# Patient Record
Sex: Female | Born: 1955 | Race: White | Hispanic: No | Marital: Single | Smoking: Current every day smoker
Health system: Southern US, Community
[De-identification: ages and names within clinical notes are randomized; demographics above are authoritative.]

---

## 2016-03-20 DIAGNOSIS — M1990 Unspecified osteoarthritis, unspecified site: Secondary | ICD-10-CM | POA: Diagnosis not present

## 2016-03-20 DIAGNOSIS — Z1211 Encounter for screening for malignant neoplasm of colon: Secondary | ICD-10-CM | POA: Diagnosis not present

## 2016-03-20 DIAGNOSIS — Z91038 Other insect allergy status: Secondary | ICD-10-CM | POA: Diagnosis not present

## 2016-03-20 DIAGNOSIS — R413 Other amnesia: Secondary | ICD-10-CM | POA: Diagnosis not present

## 2016-03-20 DIAGNOSIS — Z1389 Encounter for screening for other disorder: Secondary | ICD-10-CM | POA: Diagnosis not present

## 2016-03-20 DIAGNOSIS — I10 Essential (primary) hypertension: Secondary | ICD-10-CM | POA: Diagnosis not present

## 2016-03-20 DIAGNOSIS — F329 Major depressive disorder, single episode, unspecified: Secondary | ICD-10-CM | POA: Diagnosis not present

## 2016-03-22 DIAGNOSIS — E538 Deficiency of other specified B group vitamins: Secondary | ICD-10-CM | POA: Diagnosis not present

## 2016-04-02 DIAGNOSIS — F0390 Unspecified dementia without behavioral disturbance: Secondary | ICD-10-CM | POA: Diagnosis not present

## 2016-04-02 DIAGNOSIS — R413 Other amnesia: Secondary | ICD-10-CM | POA: Diagnosis not present

## 2016-04-04 DIAGNOSIS — G309 Alzheimer's disease, unspecified: Secondary | ICD-10-CM | POA: Diagnosis not present

## 2016-04-04 DIAGNOSIS — G300 Alzheimer's disease with early onset: Secondary | ICD-10-CM | POA: Diagnosis not present

## 2016-05-07 DIAGNOSIS — Z23 Encounter for immunization: Secondary | ICD-10-CM | POA: Diagnosis not present

## 2016-05-09 DIAGNOSIS — F17200 Nicotine dependence, unspecified, uncomplicated: Secondary | ICD-10-CM | POA: Diagnosis not present

## 2016-05-09 DIAGNOSIS — Z79899 Other long term (current) drug therapy: Secondary | ICD-10-CM | POA: Diagnosis not present

## 2016-05-09 DIAGNOSIS — Z1211 Encounter for screening for malignant neoplasm of colon: Secondary | ICD-10-CM | POA: Diagnosis not present

## 2016-05-09 DIAGNOSIS — F329 Major depressive disorder, single episode, unspecified: Secondary | ICD-10-CM | POA: Diagnosis not present

## 2016-05-09 DIAGNOSIS — I10 Essential (primary) hypertension: Secondary | ICD-10-CM | POA: Diagnosis not present

## 2016-05-20 DIAGNOSIS — E538 Deficiency of other specified B group vitamins: Secondary | ICD-10-CM | POA: Diagnosis not present

## 2016-05-20 DIAGNOSIS — J309 Allergic rhinitis, unspecified: Secondary | ICD-10-CM | POA: Diagnosis not present

## 2016-05-20 DIAGNOSIS — R413 Other amnesia: Secondary | ICD-10-CM | POA: Diagnosis not present

## 2016-05-20 DIAGNOSIS — Z124 Encounter for screening for malignant neoplasm of cervix: Secondary | ICD-10-CM | POA: Diagnosis not present

## 2016-05-27 DIAGNOSIS — Z1231 Encounter for screening mammogram for malignant neoplasm of breast: Secondary | ICD-10-CM | POA: Diagnosis not present

## 2016-07-28 DIAGNOSIS — J019 Acute sinusitis, unspecified: Secondary | ICD-10-CM | POA: Diagnosis not present

## 2016-07-28 DIAGNOSIS — R509 Fever, unspecified: Secondary | ICD-10-CM | POA: Diagnosis not present

## 2016-07-28 DIAGNOSIS — J209 Acute bronchitis, unspecified: Secondary | ICD-10-CM | POA: Diagnosis not present

## 2016-10-20 DIAGNOSIS — J209 Acute bronchitis, unspecified: Secondary | ICD-10-CM | POA: Diagnosis not present

## 2016-10-20 DIAGNOSIS — H6691 Otitis media, unspecified, right ear: Secondary | ICD-10-CM | POA: Diagnosis not present

## 2017-03-17 DIAGNOSIS — J309 Allergic rhinitis, unspecified: Secondary | ICD-10-CM | POA: Diagnosis not present

## 2017-03-17 DIAGNOSIS — M1990 Unspecified osteoarthritis, unspecified site: Secondary | ICD-10-CM | POA: Diagnosis not present

## 2017-03-17 DIAGNOSIS — R413 Other amnesia: Secondary | ICD-10-CM | POA: Diagnosis not present

## 2017-03-17 DIAGNOSIS — I10 Essential (primary) hypertension: Secondary | ICD-10-CM | POA: Diagnosis not present

## 2017-03-17 DIAGNOSIS — Z681 Body mass index (BMI) 19 or less, adult: Secondary | ICD-10-CM | POA: Diagnosis not present

## 2017-03-30 DIAGNOSIS — K8020 Calculus of gallbladder without cholecystitis without obstruction: Secondary | ICD-10-CM | POA: Diagnosis not present

## 2017-03-30 DIAGNOSIS — F17210 Nicotine dependence, cigarettes, uncomplicated: Secondary | ICD-10-CM | POA: Diagnosis not present

## 2017-03-30 DIAGNOSIS — J189 Pneumonia, unspecified organism: Secondary | ICD-10-CM | POA: Diagnosis not present

## 2017-03-30 DIAGNOSIS — I10 Essential (primary) hypertension: Secondary | ICD-10-CM | POA: Diagnosis not present

## 2017-03-30 DIAGNOSIS — M2560 Stiffness of unspecified joint, not elsewhere classified: Secondary | ICD-10-CM | POA: Diagnosis not present

## 2017-03-30 DIAGNOSIS — F0280 Dementia in other diseases classified elsewhere without behavioral disturbance: Secondary | ICD-10-CM | POA: Diagnosis not present

## 2017-03-30 DIAGNOSIS — Z880 Allergy status to penicillin: Secondary | ICD-10-CM | POA: Diagnosis not present

## 2017-03-30 DIAGNOSIS — R634 Abnormal weight loss: Secondary | ICD-10-CM | POA: Diagnosis not present

## 2017-03-30 DIAGNOSIS — R4182 Altered mental status, unspecified: Secondary | ICD-10-CM | POA: Diagnosis not present

## 2017-03-30 DIAGNOSIS — J449 Chronic obstructive pulmonary disease, unspecified: Secondary | ICD-10-CM | POA: Diagnosis not present

## 2017-04-08 DIAGNOSIS — J432 Centrilobular emphysema: Secondary | ICD-10-CM | POA: Diagnosis not present

## 2017-04-08 DIAGNOSIS — N3001 Acute cystitis with hematuria: Secondary | ICD-10-CM | POA: Diagnosis not present

## 2017-04-08 DIAGNOSIS — I10 Essential (primary) hypertension: Secondary | ICD-10-CM | POA: Diagnosis not present

## 2017-04-08 DIAGNOSIS — R938 Abnormal findings on diagnostic imaging of other specified body structures: Secondary | ICD-10-CM | POA: Diagnosis not present

## 2017-04-08 DIAGNOSIS — G9341 Metabolic encephalopathy: Secondary | ICD-10-CM | POA: Diagnosis not present

## 2017-04-08 DIAGNOSIS — Z6820 Body mass index (BMI) 20.0-20.9, adult: Secondary | ICD-10-CM | POA: Diagnosis not present

## 2017-04-09 DIAGNOSIS — Z1211 Encounter for screening for malignant neoplasm of colon: Secondary | ICD-10-CM | POA: Diagnosis not present

## 2017-05-10 ENCOUNTER — Encounter (HOSPITAL_COMMUNITY): Payer: Self-pay

## 2017-05-10 ENCOUNTER — Emergency Department (HOSPITAL_COMMUNITY): Payer: PPO

## 2017-05-10 ENCOUNTER — Emergency Department (HOSPITAL_COMMUNITY)
Admission: EM | Admit: 2017-05-10 | Discharge: 2017-05-11 | Disposition: A | Discharge status: Home / Self Care | Payer: PPO | Attending: Emergency Medicine | Admitting: Emergency Medicine

## 2017-05-10 DIAGNOSIS — R410 Disorientation, unspecified: Secondary | ICD-10-CM

## 2017-05-10 DIAGNOSIS — F0390 Unspecified dementia without behavioral disturbance: Secondary | ICD-10-CM

## 2017-05-10 DIAGNOSIS — Z79899 Other long term (current) drug therapy: Secondary | ICD-10-CM | POA: Insufficient documentation

## 2017-05-10 DIAGNOSIS — J90 Pleural effusion, not elsewhere classified: Secondary | ICD-10-CM | POA: Insufficient documentation

## 2017-05-10 DIAGNOSIS — F17200 Nicotine dependence, unspecified, uncomplicated: Secondary | ICD-10-CM | POA: Diagnosis not present

## 2017-05-10 DIAGNOSIS — R918 Other nonspecific abnormal finding of lung field: Secondary | ICD-10-CM | POA: Diagnosis not present

## 2017-05-10 DIAGNOSIS — R4182 Altered mental status, unspecified: Secondary | ICD-10-CM | POA: Diagnosis not present

## 2017-05-10 LAB — PROTIME-INR
INR: 0.97
PROTHROMBIN TIME: 12.8 s (ref 11.4–15.2)

## 2017-05-10 LAB — CBC WITH DIFFERENTIAL/PLATELET
BASOS PCT: 0 %
Basophils Absolute: 0 10*3/uL (ref 0.0–0.1)
EOS ABS: 0.1 10*3/uL (ref 0.0–0.7)
Eosinophils Relative: 2 %
HCT: 30.8 % — ABNORMAL LOW (ref 36.0–46.0)
Hemoglobin: 10.1 g/dL — ABNORMAL LOW (ref 12.0–15.0)
LYMPHS ABS: 2.5 10*3/uL (ref 0.7–4.0)
Lymphocytes Relative: 43 %
MCH: 29.4 pg (ref 26.0–34.0)
MCHC: 32.8 g/dL (ref 30.0–36.0)
MCV: 89.8 fL (ref 78.0–100.0)
Monocytes Absolute: 0.6 10*3/uL (ref 0.1–1.0)
Monocytes Relative: 10 %
NEUTROS PCT: 45 %
Neutro Abs: 2.6 10*3/uL (ref 1.7–7.7)
Platelets: 216 10*3/uL (ref 150–400)
RBC: 3.43 MIL/uL — AB (ref 3.87–5.11)
RDW: 15.8 % — ABNORMAL HIGH (ref 11.5–15.5)
WBC: 5.8 10*3/uL (ref 4.0–10.5)

## 2017-05-10 LAB — URINALYSIS, ROUTINE W REFLEX MICROSCOPIC
BACTERIA UA: NONE SEEN
BILIRUBIN URINE: NEGATIVE
Glucose, UA: NEGATIVE mg/dL
Ketones, ur: NEGATIVE mg/dL
Leukocytes, UA: NEGATIVE
NITRITE: NEGATIVE
PH: 6 (ref 5.0–8.0)
Protein, ur: NEGATIVE mg/dL
SPECIFIC GRAVITY, URINE: 1.017 (ref 1.005–1.030)

## 2017-05-10 LAB — COMPREHENSIVE METABOLIC PANEL
ALBUMIN: 3.4 g/dL — AB (ref 3.5–5.0)
ALT: 22 U/L (ref 14–54)
ANION GAP: 7 (ref 5–15)
AST: 29 U/L (ref 15–41)
Alkaline Phosphatase: 74 U/L (ref 38–126)
BILIRUBIN TOTAL: 0.5 mg/dL (ref 0.3–1.2)
BUN: 17 mg/dL (ref 6–20)
CO2: 26 mmol/L (ref 22–32)
Calcium: 8.6 mg/dL — ABNORMAL LOW (ref 8.9–10.3)
Chloride: 105 mmol/L (ref 101–111)
Creatinine, Ser: 0.63 mg/dL (ref 0.44–1.00)
GFR calc Af Amer: >60 mL/min (ref >60)
GFR calc non Af Amer: >60 mL/min (ref >60)
Glucose, Bld: 94 mg/dL (ref 65–99)
POTASSIUM: 3.4 mmol/L — AB (ref 3.5–5.1)
SODIUM: 138 mmol/L (ref 135–145)
TOTAL PROTEIN: 7.4 g/dL (ref 6.5–8.1)

## 2017-05-10 LAB — I-STAT CG4 LACTIC ACID, ED: Lactic Acid, Venous: 0.86 mmol/L (ref 0.5–1.9)

## 2017-05-10 LAB — CBG MONITORING, ED: Glucose-Capillary: 88 mg/dL (ref 65–99)

## 2017-05-10 NOTE — ED Notes (Signed)
The pt  Remains alert warm blankets given

## 2017-05-10 NOTE — ED Notes (Signed)
Family reports that the pt has been more confused for one month  She  Sees people that are not there.  She has tried to leave home numerus times  This behavior appears to be getting worse  The pt reports that she does not know why she is here

## 2017-05-10 NOTE — ED Provider Notes (Signed)
[facility]-EMERGENCY DEPT Provider Note   CSN: [id number] Arrival date & time: 05/10/17  2110  Time seen 23:04 PM   History   Chief Complaint Chief Complaint  Patient presents with  . Altered Mental Status    HPI Monique Walker is a 61 y.o. female.  HPI  she presents to the emergency department with her son and her sister. They report patient was diagnosed with memory problems about 3 years ago. She has been on donezapril for about one year. Patient states about a month ago they increased the dose from 5 mg up to 10 however she seemed to be having more confusion and they dropped back down to 5 mg about 2 weeks ago. Son states about a month ago she had a UTI was diagnosed when she presented with increasing confusion. She was diagnosed with pneumonia, UTI and dehydration. She was treated in the ED and discharged home with antibiotics. She  followed up at her primary care office a week later and was improved. She was supposed to have her repeat urinalysis because of some hematuria in a month which will be this coming week. They report that she was doing well  3 days ago however today she was out with her sister and they were shopping. She refused to get back into her sister's car and finally she got in the car and when they got home she kept trying to leave her sister's house. Patient states she doesn't know why she's here. She does not say she feels bad. 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 When he played it back to her today she did not know that was her that had called and left the message. She has also been taking things out of the house and leaving them outside because she is cleaning, but family states they are things that shouldn't be thrown away.   PCP Dr Wilber [facility] in [place]  History reviewed. No pertinent past medical  history.  There are no active problems to display for this patient.   History reviewed. No pertinent surgical history.  OB History    No data available       Home Medications    Prior to Admission medications   Medication Sig Start Date End Date Taking? Authorizing Provider  albuterol (PROVENTIL HFA;VENTOLIN HFA) 108 (90 Base) MCG/ACT inhaler Inhale 1-2 puffs into the lungs every 6 (six) hours as needed for wheezing or shortness of breath.   Yes [provider]  citalopram (CELEXA) 20 MG tablet Take 20 mg by mouth every morning.   Yes [provider]  donepezil (ARICEPT) 10 MG tablet Take 5 mg by mouth at bedtime.   Yes [provider]  fexofenadine (ALLEGRA) 180 MG tablet Take 180 mg by mouth daily.   Yes [provider]  lisinopril (PRINIVIL,ZESTRIL) 40 MG tablet Take 40 mg by mouth daily.   Yes [provider]  meloxicam (MOBIC) 7.5 MG tablet Take 7.5 mg by mouth every morning.   Yes [provider]  Multiple Vitamin (MULTIVITAMIN WITH MINERALS) TABS tablet Take 1 tablet by mouth every morning.   Yes [provider]    Family History History reviewed. No pertinent family history.  Social History Social History  Substance Use Topics  . Smoking status: Current Every  Day Smoker  . Smokeless tobacco: Never Used  . Alcohol use Not on file  lives alone Smokes < 1 ppd   Allergies   Penicillins   Review of Systems Review of Systems  All other systems reviewed and are negative.    Physical Exam Updated Vital Signs BP (!) 189/93   Pulse 66   Temp 99.1 F (37.3 C) (Oral)   Resp (!) 8   SpO2 99%   Vital signs normal except hypertension, low grade temp   Physical Exam  Constitutional:  Non-toxic appearance. She does not appear ill. No distress.  Thin female  HENT:  Head: Normocephalic and atraumatic.  Right Ear: External ear normal.  Left Ear: External ear normal.  Nose: Nose normal. No mucosal  edema or rhinorrhea.  Mouth/Throat: Oropharynx is clear and moist and mucous membranes are normal. No dental abscesses or uvula swelling.  Eyes: Pupils are equal, round, and reactive to light. Conjunctivae and EOM are normal.  Neck: Normal range of motion and full passive range of motion without pain. Neck supple.  Cardiovascular: Normal rate, regular rhythm and normal heart sounds.  Exam reveals no gallop and no friction rub.   No murmur heard. Pulmonary/Chest: Effort normal and breath sounds normal. No respiratory distress. She has no wheezes. She has no rhonchi. She has no rales. She exhibits no tenderness and no crepitus.  Abdominal: Soft. Normal appearance and bowel sounds are normal. She exhibits no distension. There is no tenderness. There is no rebound and no guarding.  Musculoskeletal: Normal range of motion. She exhibits no edema or tenderness.  Moves all extremities well.   Neurological: She is alert. She has normal strength. No cranial nerve deficit.  Does not know the correct year (2019), doesn't know the month, doesn't know the season, but states it is getting cooler, doesn't know the day of the week. Recognizes her family. Grips equal and strong bilaterally. No focal motor weakness.   Skin: Skin is warm, dry and intact. No rash noted. No erythema. No pallor.  Psychiatric: She has a normal mood and affect. Her speech is normal and behavior is normal. Her mood appears not anxious.  Nursing note and vitals reviewed.    ED Treatments / Results  Labs (all labs ordered are listed, but only abnormal results are displayed) Results for orders placed or performed during the hospital encounter of 05/10/17  Comprehensive metabolic panel  Result Value Ref Range   Sodium 138 135 - 145 mmol/L   Potassium 3.4 (L) 3.5 - 5.1 mmol/L   Chloride 105 101 - 111 mmol/L   CO2 26 22 - 32 mmol/L   Glucose, Bld 94 65 - 99 mg/dL   BUN 17 6 - 20 mg/dL   Creatinine, Ser 1.61 0.44 - 1.00 mg/dL    Calcium 8.6 (L) 8.9 - 10.3 mg/dL   Total Protein 7.4 6.5 - 8.1 g/dL   Albumin 3.4 (L) 3.5 - 5.0 g/dL   AST 29 15 - 41 U/L   ALT 22 14 - 54 U/L   Alkaline Phosphatase 74 38 - 126 U/L   Total Bilirubin 0.5 0.3 - 1.2 mg/dL   GFR calc non Af Amer >60 >60 mL/min   GFR calc Af Amer >60 >60 mL/min   Anion gap 7 5 - 15  CBC with Differential  Result Value Ref Range   WBC 5.8 4.0 - 10.5 K/uL   RBC 3.43 (L) 3.87 - 5.11 MIL/uL   Hemoglobin 10.1 (L) 12.0 - 15.0  g/dL   HCT 16.1 (L) 09.6 - 04.5 %   MCV 89.8 78.0 - 100.0 fL   MCH 29.4 26.0 - 34.0 pg   MCHC 32.8 30.0 - 36.0 g/dL   RDW 40.9 (H) 81.1 - 91.4 %   Platelets 216 150 - 400 K/uL   Neutrophils Relative % 45 %   Neutro Abs 2.6 1.7 - 7.7 K/uL   Lymphocytes Relative 43 %   Lymphs Abs 2.5 0.7 - 4.0 K/uL   Monocytes Relative 10 %   Monocytes Absolute 0.6 0.1 - 1.0 K/uL   Eosinophils Relative 2 %   Eosinophils Absolute 0.1 0.0 - 0.7 K/uL   Basophils Relative 0 %   Basophils Absolute 0.0 0.0 - 0.1 K/uL  Protime-INR  Result Value Ref Range   Prothrombin Time 12.8 11.4 - 15.2 seconds   INR 0.97   Urinalysis, Routine w reflex microscopic  Result Value Ref Range   Color, Urine YELLOW YELLOW   APPearance CLEAR CLEAR   Specific Gravity, Urine 1.017 1.005 - 1.030   pH 6.0 5.0 - 8.0   Glucose, UA NEGATIVE NEGATIVE mg/dL   Hgb urine dipstick SMALL (A) NEGATIVE   Bilirubin Urine NEGATIVE NEGATIVE   Ketones, ur NEGATIVE NEGATIVE mg/dL   Protein, ur NEGATIVE NEGATIVE mg/dL   Nitrite NEGATIVE NEGATIVE   Leukocytes, UA NEGATIVE NEGATIVE   RBC / HPF 0-5 0 - 5 RBC/hpf   WBC, UA 0-5 0 - 5 WBC/hpf   Bacteria, UA NONE SEEN NONE SEEN   Squamous Epithelial / LPF 0-5 (A) NONE SEEN  CBG monitoring, ED  Result Value Ref Range   Glucose-Capillary 88 65 - 99 mg/dL  I-Stat CG4 Lactic Acid, ED  Result Value Ref Range   Lactic Acid, Venous 0.86 0.5 - 1.9 mmol/L  I-Stat CG4 Lactic Acid, ED  Result Value Ref Range   Lactic Acid, Venous 0.88 0.5 - 1.9  mmol/L   Laboratory interpretation all normal except mild hypokalemia, mild anemia    EKG  EKG Interpretation None       Radiology Dg Chest 2 View  Result Date: 05/10/2017 CLINICAL DATA:  61 y/o F; history of urinary tract infection post antibiotic therapy and history of pneumonia. EXAM: CHEST  2 VIEW COMPARISON:  None. FINDINGS: Mild cardiomegaly. Aortic atherosclerosis with calcification. Soft tissue thickening at the right lung apex and small right pleural effusion. No focal consolidation. No pneumothorax. T7 mild compression deformity, age indeterminate. IMPRESSION: 1. Soft tissue thickening at the right lung apex may represent pleuroparenchymal scarring versus Pancoast tumor. CT of chest is recommended to further characterize. 2. Small right pleural effusion. 3. No consolidation. 4. Age-indeterminate T7 mild compression deformity. Electronically Signed   By: Mitzi Hansen M.D.   On: 05/10/2017 22:13   Ct Head Wo Contrast  Result Date: 05/10/2017 CLINICAL DATA:  61 y/o F; altered mental status since urinary tract infection 1 month ago. EXAM: CT HEAD WITHOUT CONTRAST TECHNIQUE: Contiguous axial images were obtained from the base of the skull through the vertex without intravenous contrast. COMPARISON:  04/02/2016 MRI the head. FINDINGS: Brain: No evidence of acute infarction, hemorrhage, hydrocephalus, extra-axial collection or mass lesion/mass effect. Few nonspecific foci of hypoattenuation in subcortical periventricular white matter compatible with mild chronic microvascular ischemic changes. Mild parenchymal volume loss for age. Vascular: No hyperdense vessel or unexpected calcification. Skull: Normal. Negative for fracture or focal lesion. Sinuses/Orbits: No acute finding. Other: None. IMPRESSION: 1. No acute intracranial abnormality identified. 2. Mild chronic microvascular ischemic changes  and mild parenchymal volume loss of the brain. Electronically Signed   By: Mitzi Hansen M.D.   On: 05/10/2017 22:20   Ct Chest W Contrast  Result Date: 05/11/2017 CLINICAL DATA:  Smoker. Abnormal chest x-ray. Increasing confusion. EXAM: CT CHEST WITH CONTRAST TECHNIQUE: Multidetector CT imaging of the chest was performed during intravenous contrast administration. CONTRAST:  75 cc ISOVUE-300 IOPAMIDOL (ISOVUE-300) INJECTION 61% COMPARISON:  Chest radiograph earlier this day. Report from chest CT 03/30/2017, images not available. FINDINGS: Cardiovascular: Aortic atherosclerosis without aneurysm. Mild cardiomegaly. Scattered coronary artery calcifications. No pericardial fluid. Mediastinum/Nodes: Prominent right hilar node measures 11 mm short axis. Precarinal node measures 9 mm, subcarinal node measures 11 mm. Additional small prevascular nodes not enlarged by size criteria. The esophagus is decompressed. Visualized thyroid gland is normal. Lungs/Pleura: Right apical pleuroparenchymal scarring, with bronchiectasis and air bronchograms. Additional in bronchiectasis in the right middle lobe. There is mild diffuse pleural thickening throughout the right hemithorax. Bandlike opacity in the right lower lobe. 7 mm irregular opacity in the right middle lobe image 78 series 4 is likely related to pleuroparenchymal scarring, however is nonspecific. Patchy left lower lobe ground-glass opacities. No dominant pulmonary mass. Minimal mucus in the trachea. Upper Abdomen: No acute abnormality.  Normal adrenal glands. Musculoskeletal: Mild T8 inferior endplate compression fracture, appears chronic. Scattered degenerative disc disease in the thoracic spine. IMPRESSION: 1. Right apical pleuroparenchymal opacity, with associated bronchiectasis, likely related to scarring. This was described on chest CT 1 month prior. Chronic bronchiectasis in the right middle lobe. 2. Irregular 7 mm right middle lobe opacity which may be related to pleuroparenchymal scarring but is nonspecific. Recommend follow-up as  a pulmonary nodule. Non-contrast chest CT at 6-12 months is recommended. If the nodule is stable at time of repeat CT, then future CT at 18-24 months (from today's scan) is considered optional for low-risk patients, but is recommended for high-risk patients. This recommendation follows the consensus statement: Guidelines for Management of Incidental Pulmonary Nodules Detected on CT Images: From the Fleischner Society 2017; Radiology 2017; 284:228-243. 3. Mild mediastinal and right hilar adenopathy with slight decrease per report. . Electronically Signed   By: Rubye Oaks M.D.   On: 05/11/2017 02:38    Procedures Procedures (including critical care time)  Medications Ordered in ED Medications  iopamidol (ISOVUE-300) 61 % injection (75 mLs  Contrast Given 05/11/17 0133)     Initial Impression / Assessment and Plan / ED Course  I have reviewed the triage vital signs and the nursing notes.  Pertinent labs & imaging results that were available during my care of the patient were reviewed by me and considered in my medical decision making (see chart for details).     We discussed her test results which do not reveal a medical reason for her worsening confusion. Family wants to go ahead and do the chest CT tonight. We also discussed she should f/u with her primary care doctor about the progression of her dementia and consider evaluation by a psychiatrist for medication recommendationsAnd a neurologist to get a more specific diagnosis of her dementia..   Radiologist called her chest CT results, evidentially patient had already had a CT of her chest and abd pelvis and head done on Aug 18 at [facility] that family did not make me aware of.    Final Clinical Impressions(s) / ED Diagnoses   Final diagnoses:  Confusion  Dementia without behavioral disturbance, unspecified dementia type   Plan discharge  Devoria Albe, MD,  Concha Pyo, MD 05/11/17 [phone]

## 2017-05-10 NOTE — ED Notes (Addendum)
Pt. Taken to xray, xray will return pt to room

## 2017-05-10 NOTE — ED Triage Notes (Signed)
Pt. Family states she had a UTI a month ago and pt. Presented altered. Family states the antibiotics helped the ALMS once UTI was cleared. Family states they were supposed to follow up for blood found in urine but decided to come to the emergency room instead.

## 2017-05-11 ENCOUNTER — Encounter (HOSPITAL_COMMUNITY): Payer: Self-pay | Admitting: Radiology

## 2017-05-11 ENCOUNTER — Emergency Department (HOSPITAL_COMMUNITY): Payer: PPO

## 2017-05-11 DIAGNOSIS — R918 Other nonspecific abnormal finding of lung field: Secondary | ICD-10-CM | POA: Diagnosis not present

## 2017-05-11 LAB — I-STAT CG4 LACTIC ACID, ED: Lactic Acid, Venous: 0.88 mmol/L (ref 0.5–1.9)

## 2017-05-11 MED ORDER — IOPAMIDOL (ISOVUE-300) INJECTION 61%
INTRAVENOUS | Status: AC
  Administered 2017-05-11: 75 mL
  Filled 2017-05-11: qty 75

## 2017-05-11 MED ORDER — IOPAMIDOL (ISOVUE-300) INJECTION 61%
INTRAVENOUS | Status: AC
  Filled 2017-05-11: qty 100

## 2017-05-11 NOTE — Discharge Instructions (Signed)
You need to talk to your doctor about getting a referral to be evaluated by a neurologist and a psychiatrist for further evaluation of your memory problems and intermittent confusion. A urine culture was sent to night however your urine did not appear to have an acute infection tonight. Your CT scan showed some chronic scarring in your right chest. The radiologist recommends a repeat CT scan of your chest in 6-12 months.

## 2017-05-12 LAB — URINE CULTURE

## 2017-05-15 LAB — CULTURE, BLOOD (ROUTINE X 2)
CULTURE: NO GROWTH
Culture: NO GROWTH
SPECIAL REQUESTS: ADEQUATE
Special Requests: ADEQUATE

## 2017-05-16 DIAGNOSIS — I10 Essential (primary) hypertension: Secondary | ICD-10-CM | POA: Diagnosis not present

## 2017-05-16 DIAGNOSIS — R413 Other amnesia: Secondary | ICD-10-CM | POA: Diagnosis not present

## 2017-05-16 DIAGNOSIS — Z6822 Body mass index (BMI) 22.0-22.9, adult: Secondary | ICD-10-CM | POA: Diagnosis not present

## 2017-06-09 DIAGNOSIS — Z23 Encounter for immunization: Secondary | ICD-10-CM | POA: Diagnosis not present

## 2017-07-15 DIAGNOSIS — Z6824 Body mass index (BMI) 24.0-24.9, adult: Secondary | ICD-10-CM | POA: Diagnosis not present

## 2017-07-15 DIAGNOSIS — J40 Bronchitis, not specified as acute or chronic: Secondary | ICD-10-CM | POA: Diagnosis not present

## 2017-08-11 DIAGNOSIS — N3001 Acute cystitis with hematuria: Secondary | ICD-10-CM | POA: Diagnosis not present

## 2017-08-11 DIAGNOSIS — N3091 Cystitis, unspecified with hematuria: Secondary | ICD-10-CM | POA: Diagnosis not present

## 2017-08-15 DIAGNOSIS — G309 Alzheimer's disease, unspecified: Secondary | ICD-10-CM | POA: Diagnosis not present

## 2017-08-15 DIAGNOSIS — F0390 Unspecified dementia without behavioral disturbance: Secondary | ICD-10-CM | POA: Diagnosis not present

## 2017-08-15 DIAGNOSIS — R350 Frequency of micturition: Secondary | ICD-10-CM | POA: Diagnosis not present

## 2017-08-15 DIAGNOSIS — F0280 Dementia in other diseases classified elsewhere without behavioral disturbance: Secondary | ICD-10-CM | POA: Diagnosis not present

## 2017-08-21 DIAGNOSIS — F411 Generalized anxiety disorder: Secondary | ICD-10-CM | POA: Diagnosis not present

## 2017-08-21 DIAGNOSIS — F320 Major depressive disorder, single episode, mild: Secondary | ICD-10-CM | POA: Diagnosis not present

## 2017-08-21 DIAGNOSIS — F0281 Dementia in other diseases classified elsewhere with behavioral disturbance: Secondary | ICD-10-CM | POA: Diagnosis not present

## 2017-08-29 DIAGNOSIS — G2111 Neuroleptic induced parkinsonism: Secondary | ICD-10-CM | POA: Diagnosis not present

## 2017-08-29 DIAGNOSIS — G309 Alzheimer's disease, unspecified: Secondary | ICD-10-CM | POA: Diagnosis not present

## 2017-09-08 DIAGNOSIS — R413 Other amnesia: Secondary | ICD-10-CM | POA: Diagnosis not present

## 2017-09-08 DIAGNOSIS — I10 Essential (primary) hypertension: Secondary | ICD-10-CM | POA: Diagnosis not present

## 2017-09-08 DIAGNOSIS — R410 Disorientation, unspecified: Secondary | ICD-10-CM | POA: Diagnosis not present

## 2017-09-16 DIAGNOSIS — R443 Hallucinations, unspecified: Secondary | ICD-10-CM | POA: Diagnosis not present

## 2017-09-16 DIAGNOSIS — W540XXA Bitten by dog, initial encounter: Secondary | ICD-10-CM | POA: Diagnosis not present

## 2017-09-16 DIAGNOSIS — R413 Other amnesia: Secondary | ICD-10-CM | POA: Diagnosis not present

## 2017-09-16 DIAGNOSIS — Z6823 Body mass index (BMI) 23.0-23.9, adult: Secondary | ICD-10-CM | POA: Diagnosis not present

## 2017-09-16 DIAGNOSIS — S41159A Open bite of unspecified upper arm, initial encounter: Secondary | ICD-10-CM | POA: Diagnosis not present

## 2017-09-23 DIAGNOSIS — Z6822 Body mass index (BMI) 22.0-22.9, adult: Secondary | ICD-10-CM | POA: Diagnosis not present

## 2017-09-23 DIAGNOSIS — W540XXA Bitten by dog, initial encounter: Secondary | ICD-10-CM | POA: Diagnosis not present

## 2017-09-23 DIAGNOSIS — S41159A Open bite of unspecified upper arm, initial encounter: Secondary | ICD-10-CM | POA: Diagnosis not present

## 2017-09-23 DIAGNOSIS — G309 Alzheimer's disease, unspecified: Secondary | ICD-10-CM | POA: Diagnosis not present

## 2017-09-23 DIAGNOSIS — G2111 Neuroleptic induced parkinsonism: Secondary | ICD-10-CM | POA: Diagnosis not present

## 2017-09-30 DIAGNOSIS — N3000 Acute cystitis without hematuria: Secondary | ICD-10-CM | POA: Diagnosis not present

## 2017-09-30 DIAGNOSIS — R0602 Shortness of breath: Secondary | ICD-10-CM | POA: Diagnosis not present

## 2017-09-30 DIAGNOSIS — N390 Urinary tract infection, site not specified: Secondary | ICD-10-CM

## 2017-09-30 DIAGNOSIS — F0390 Unspecified dementia without behavioral disturbance: Secondary | ICD-10-CM

## 2017-09-30 DIAGNOSIS — E876 Hypokalemia: Secondary | ICD-10-CM

## 2017-09-30 DIAGNOSIS — R55 Syncope and collapse: Secondary | ICD-10-CM

## 2017-09-30 DIAGNOSIS — E860 Dehydration: Secondary | ICD-10-CM | POA: Diagnosis not present

## 2017-09-30 DIAGNOSIS — N179 Acute kidney failure, unspecified: Secondary | ICD-10-CM

## 2017-09-30 DIAGNOSIS — I959 Hypotension, unspecified: Secondary | ICD-10-CM

## 2017-10-01 DIAGNOSIS — K8020 Calculus of gallbladder without cholecystitis without obstruction: Secondary | ICD-10-CM | POA: Diagnosis not present

## 2017-10-01 DIAGNOSIS — R0602 Shortness of breath: Secondary | ICD-10-CM | POA: Diagnosis not present

## 2017-10-01 DIAGNOSIS — R42 Dizziness and giddiness: Secondary | ICD-10-CM | POA: Diagnosis not present

## 2017-10-01 DIAGNOSIS — I959 Hypotension, unspecified: Secondary | ICD-10-CM | POA: Diagnosis not present

## 2017-10-01 DIAGNOSIS — Z6821 Body mass index (BMI) 21.0-21.9, adult: Secondary | ICD-10-CM | POA: Diagnosis not present

## 2017-10-01 DIAGNOSIS — I10 Essential (primary) hypertension: Secondary | ICD-10-CM | POA: Diagnosis not present

## 2017-10-01 DIAGNOSIS — F329 Major depressive disorder, single episode, unspecified: Secondary | ICD-10-CM | POA: Diagnosis not present

## 2017-10-01 DIAGNOSIS — R55 Syncope and collapse: Secondary | ICD-10-CM

## 2017-10-01 DIAGNOSIS — N3000 Acute cystitis without hematuria: Secondary | ICD-10-CM | POA: Diagnosis not present

## 2017-10-01 DIAGNOSIS — E876 Hypokalemia: Secondary | ICD-10-CM | POA: Diagnosis not present

## 2017-10-01 DIAGNOSIS — E872 Acidosis: Secondary | ICD-10-CM | POA: Diagnosis not present

## 2017-10-01 DIAGNOSIS — E860 Dehydration: Secondary | ICD-10-CM | POA: Diagnosis not present

## 2017-10-01 DIAGNOSIS — R627 Adult failure to thrive: Secondary | ICD-10-CM | POA: Diagnosis not present

## 2017-10-01 DIAGNOSIS — N179 Acute kidney failure, unspecified: Secondary | ICD-10-CM | POA: Diagnosis not present

## 2017-10-01 DIAGNOSIS — F17210 Nicotine dependence, cigarettes, uncomplicated: Secondary | ICD-10-CM | POA: Diagnosis not present

## 2017-10-01 DIAGNOSIS — E43 Unspecified severe protein-calorie malnutrition: Secondary | ICD-10-CM | POA: Diagnosis not present

## 2017-10-01 DIAGNOSIS — F0390 Unspecified dementia without behavioral disturbance: Secondary | ICD-10-CM | POA: Diagnosis not present

## 2017-10-01 DIAGNOSIS — Z880 Allergy status to penicillin: Secondary | ICD-10-CM | POA: Diagnosis not present

## 2017-10-01 DIAGNOSIS — R531 Weakness: Secondary | ICD-10-CM | POA: Diagnosis not present

## 2017-10-01 DIAGNOSIS — Z79899 Other long term (current) drug therapy: Secondary | ICD-10-CM | POA: Diagnosis not present

## 2017-10-01 DIAGNOSIS — M1990 Unspecified osteoarthritis, unspecified site: Secondary | ICD-10-CM | POA: Diagnosis not present

## 2017-10-01 DIAGNOSIS — Z882 Allergy status to sulfonamides status: Secondary | ICD-10-CM | POA: Diagnosis not present

## 2017-10-01 DIAGNOSIS — F0391 Unspecified dementia with behavioral disturbance: Secondary | ICD-10-CM | POA: Diagnosis not present

## 2017-10-01 DIAGNOSIS — N390 Urinary tract infection, site not specified: Secondary | ICD-10-CM | POA: Diagnosis not present

## 2017-10-03 IMAGING — CT CT CHEST W/ CM
2 of 4 series · 15 of 36 positions shown, 18 images · IV contrast (iopamidol)
Comparison: Chest radiograph earlier this day. Report from chest CT
03/30/2017, images not available.

CLINICAL DATA: Smoker. Abnormal chest x-ray. Increasing confusion.

EXAM:
CT CHEST WITH CONTRAST
TECHNIQUE: Multidetector CT imaging of the chest was performed during
intravenous contrast administration.
CONTRAST:  75 cc S2UDHM-VOO IOPAMIDOL (S2UDHM-VOO) INJECTION 61%

[Series 3: chest wo · axial · 0.61mm/px · z∈[-200,+42]mm · 12 of 143 slices shown, 15 images]
[im 11/143  mediastinal]
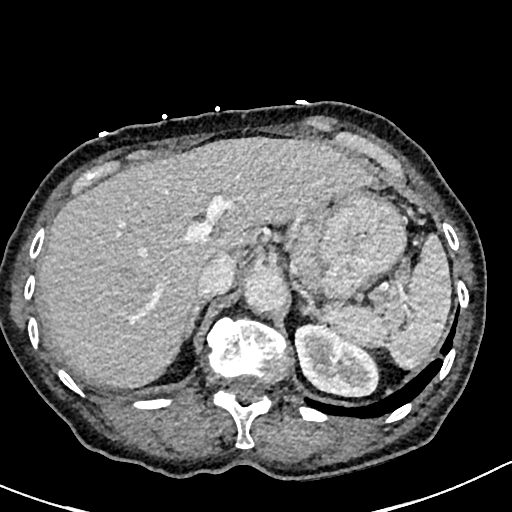
[im 11/143  lung]
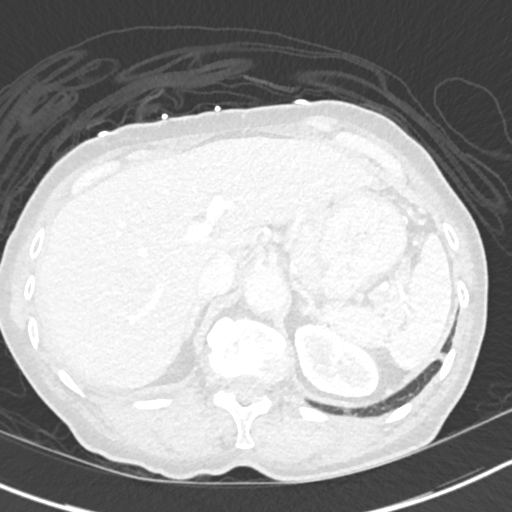
[im 22/143  lung]
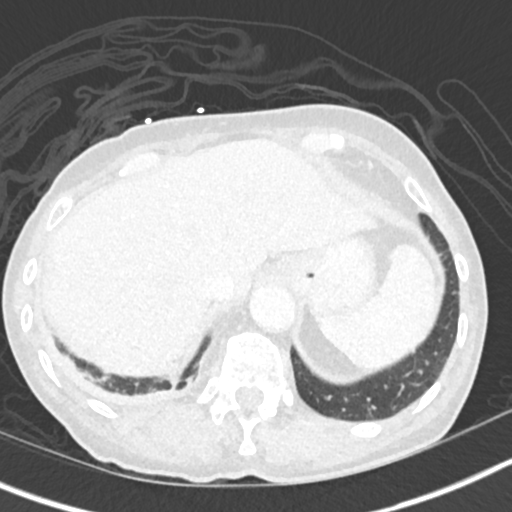
[im 33/143  lung]
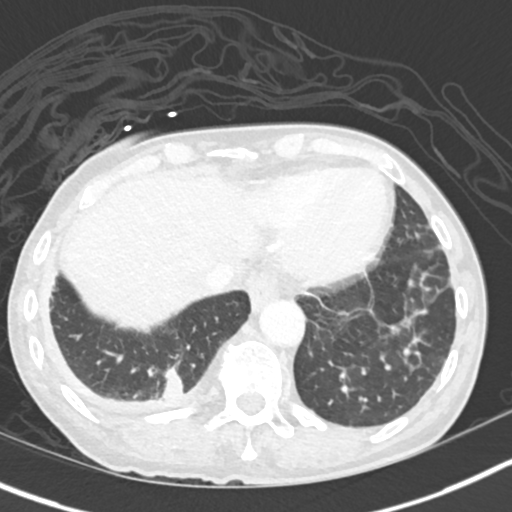
[im 44/143  lung]
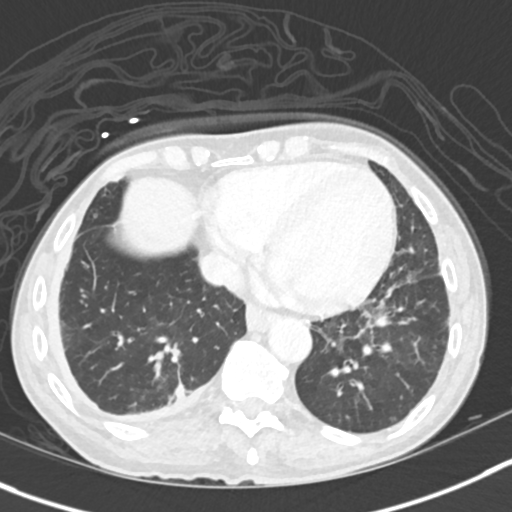
[im 55/143  mediastinal]
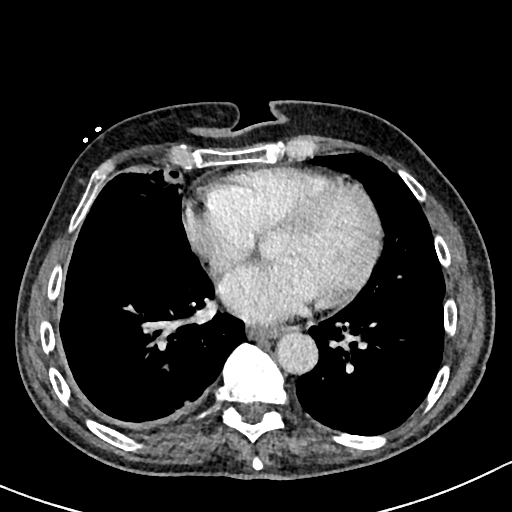
[im 55/143  lung]
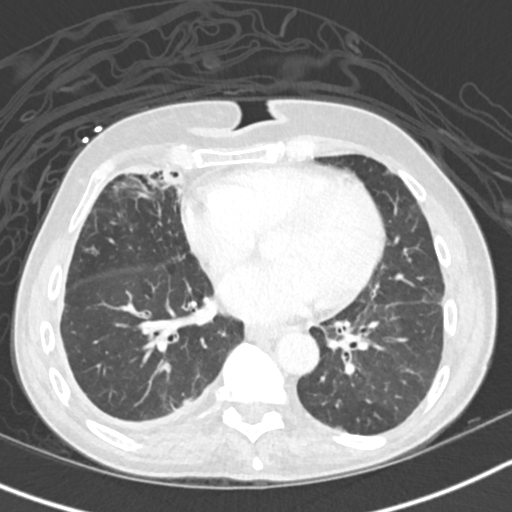
[im 66/143  lung]
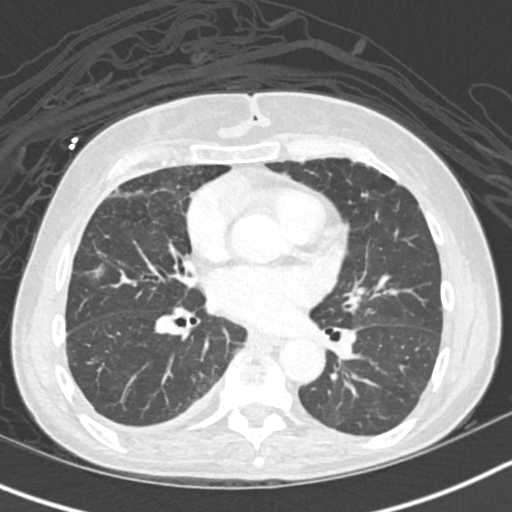
[im 77/143  lung]
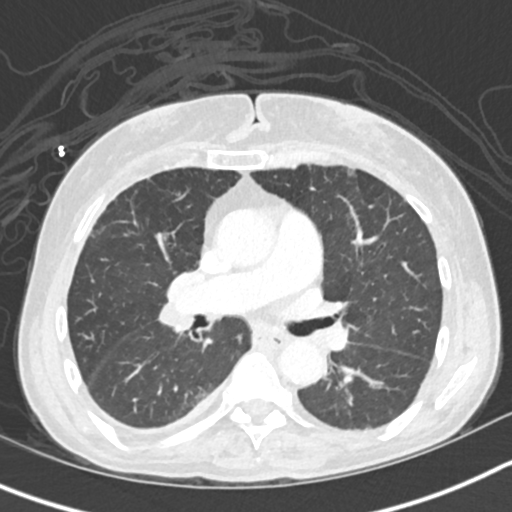
[im 88/143  lung]
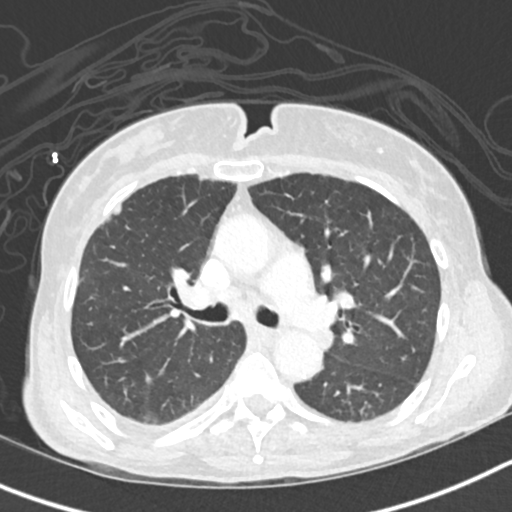
[im 99/143  mediastinal]
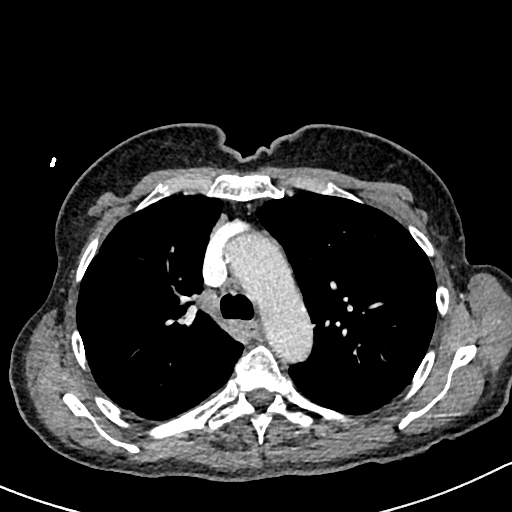
[im 99/143  lung]
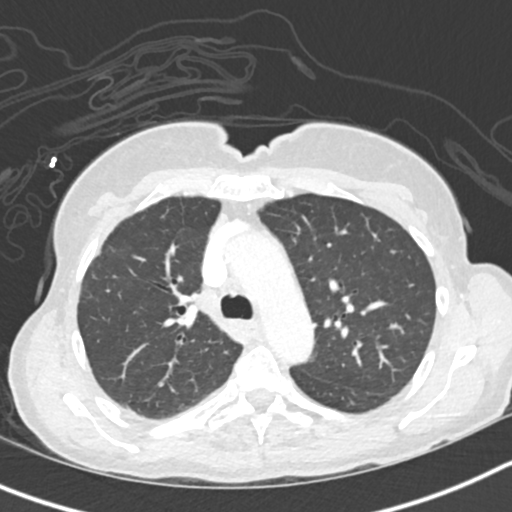
[im 110/143  lung]
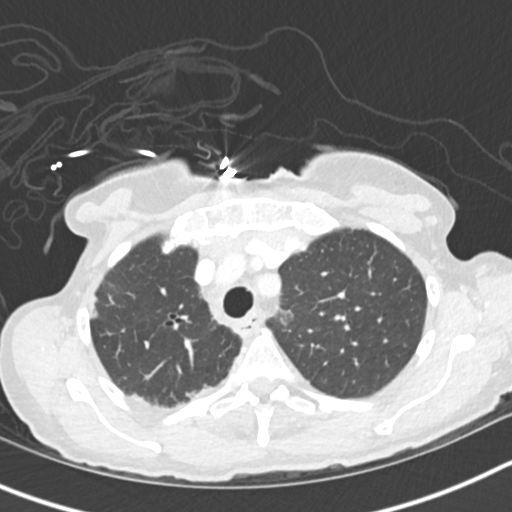
[im 121/143  lung]
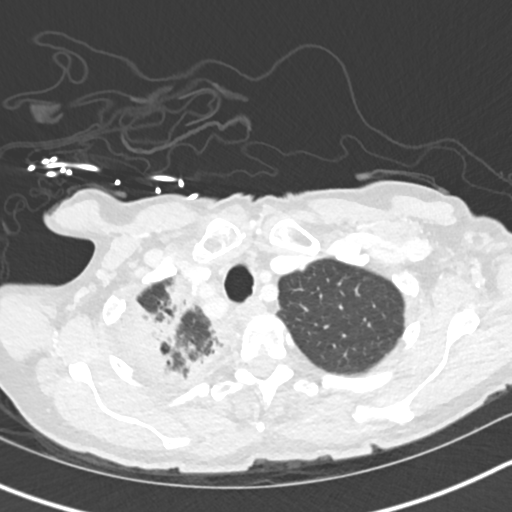
[im 132/143  lung]
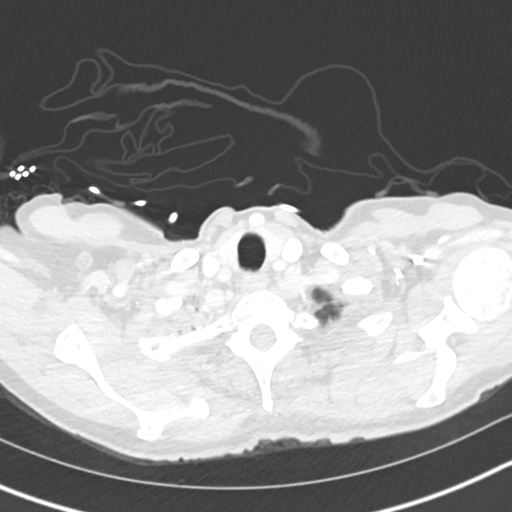

[Series 6: cor · coronal · 0.59mm/px · 3 of 116 slices shown]
[im 24/116  lung]
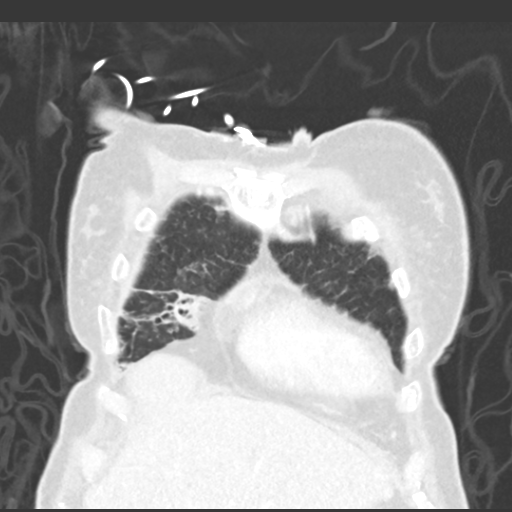
[im 47/116  lung]
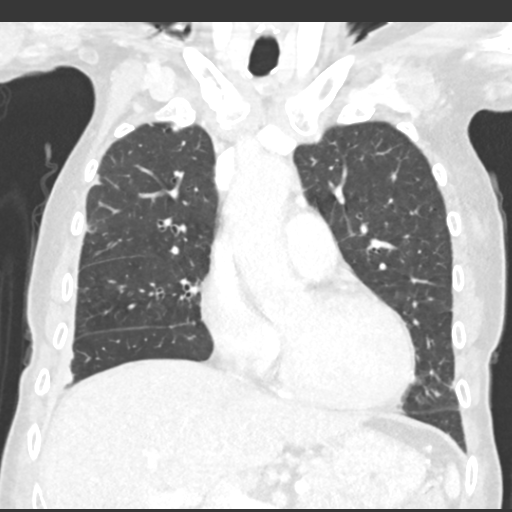
[im 70/116  lung]
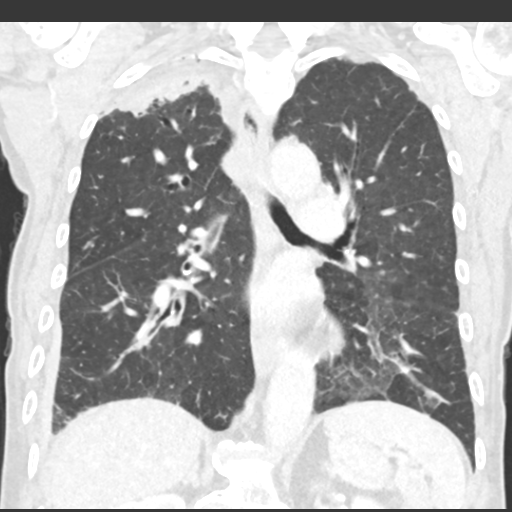

[15 of 36 positions shown; findings below may reference images not displayed]

FINDINGS: Cardiovascular: Aortic atherosclerosis without aneurysm. Mild
cardiomegaly. Scattered coronary artery calcifications. No
pericardial fluid.

Mediastinum/Nodes: Prominent right hilar node measures 11 mm short
axis. Precarinal node measures 9 mm, subcarinal node measures 11 mm.
Additional small prevascular nodes not enlarged by size criteria.
The esophagus is decompressed. Visualized thyroid gland is normal.

Lungs/Pleura: Right apical pleuroparenchymal scarring, with
bronchiectasis and air bronchograms. Additional in bronchiectasis in
the right middle lobe. There is mild diffuse pleural thickening
throughout the right hemithorax. Bandlike opacity in the right lower
lobe. 7 mm irregular opacity in the right middle lobe image 78
series 4 is likely related to pleuroparenchymal scarring, however is
nonspecific. Patchy left lower lobe ground-glass opacities. No
dominant pulmonary mass. Minimal mucus in the trachea.

Upper Abdomen: No acute abnormality.  Normal adrenal glands.

Musculoskeletal: Mild T8 inferior endplate compression fracture,
appears chronic. Scattered degenerative disc disease in the thoracic
spine.
IMPRESSION: 1. Right apical pleuroparenchymal opacity, with associated
bronchiectasis, likely related to scarring. This was described on
chest CT 1 month prior. Chronic bronchiectasis in the right middle
lobe.
2. Irregular 7 mm right middle lobe opacity which may be related to
pleuroparenchymal scarring but is nonspecific. Recommend follow-up
as a pulmonary nodule. Non-contrast chest CT at 6-12 months is
recommended. If the nodule is stable at time of repeat CT, then
future CT at 18-24 months (from today's scan) is considered optional
for low-risk patients, but is recommended for high-risk patients.
This recommendation follows the consensus statement: Guidelines for
Management of Incidental Pulmonary Nodules Detected on CT Images:
3. Mild mediastinal and right hilar adenopathy with slight decrease
per report.
.

## 2017-11-10 DEATH — deceased
